# Patient Record
Sex: Male | Born: 1975 | Race: Black or African American | Hispanic: No | Marital: Single | Smoking: Never smoker
Health system: Southern US, Community
[De-identification: ages and names within clinical notes are randomized; demographics above are authoritative.]

## PROBLEM LIST (undated history)

## (undated) DIAGNOSIS — R7303 Prediabetes: Secondary | ICD-10-CM

## (undated) HISTORY — PX: CHOLECYSTECTOMY: SHX55

---

## 2001-08-27 ENCOUNTER — Emergency Department (HOSPITAL_COMMUNITY): Admission: EM | Admit: 2001-08-27 | Discharge: 2001-08-28 | Payer: Self-pay | Admitting: Emergency Medicine

## 2001-08-28 ENCOUNTER — Encounter: Payer: Self-pay | Admitting: *Deleted

## 2016-12-01 ENCOUNTER — Emergency Department (HOSPITAL_COMMUNITY)
Admission: EM | Admit: 2016-12-01 | Discharge: 2016-12-01 | Disposition: A | Discharge status: Home / Self Care | Payer: Self-pay | Attending: Emergency Medicine | Admitting: Emergency Medicine

## 2016-12-01 ENCOUNTER — Emergency Department (HOSPITAL_COMMUNITY): Payer: Self-pay

## 2016-12-01 ENCOUNTER — Encounter (HOSPITAL_COMMUNITY): Payer: Self-pay | Admitting: Emergency Medicine

## 2016-12-01 DIAGNOSIS — R202 Paresthesia of skin: Secondary | ICD-10-CM

## 2016-12-01 DIAGNOSIS — G2581 Restless legs syndrome: Secondary | ICD-10-CM

## 2016-12-01 DIAGNOSIS — R739 Hyperglycemia, unspecified: Secondary | ICD-10-CM | POA: Insufficient documentation

## 2016-12-01 HISTORY — DX: Prediabetes: R73.03

## 2016-12-01 LAB — COMPREHENSIVE METABOLIC PANEL
ALK PHOS: 73 U/L (ref 38–126)
ALT: 35 U/L (ref 17–63)
ANION GAP: 7 (ref 5–15)
AST: 35 U/L (ref 15–41)
Albumin: 4 g/dL (ref 3.5–5.0)
BUN: 16 mg/dL (ref 6–20)
CALCIUM: 9 mg/dL (ref 8.9–10.3)
CO2: 25 mmol/L (ref 22–32)
Chloride: 103 mmol/L (ref 101–111)
Creatinine, Ser: 1.09 mg/dL (ref 0.61–1.24)
Glucose, Bld: 219 mg/dL — ABNORMAL HIGH (ref 65–99)
Potassium: 3.6 mmol/L (ref 3.5–5.1)
SODIUM: 135 mmol/L (ref 135–145)
Total Bilirubin: 1.1 mg/dL (ref 0.3–1.2)
Total Protein: 6.8 g/dL (ref 6.5–8.1)

## 2016-12-01 LAB — CBC
HCT: 41.6 % (ref 39.0–52.0)
Hemoglobin: 14.3 g/dL (ref 13.0–17.0)
MCH: 30.6 pg (ref 26.0–34.0)
MCHC: 34.4 g/dL (ref 30.0–36.0)
MCV: 88.9 fL (ref 78.0–100.0)
PLATELETS: 186 10*3/uL (ref 150–400)
RBC: 4.68 MIL/uL (ref 4.22–5.81)
RDW: 12.4 % (ref 11.5–15.5)
WBC: 10 10*3/uL (ref 4.0–10.5)

## 2016-12-01 LAB — I-STAT CHEM 8, ED
BUN: 16 mg/dL (ref 6–20)
CALCIUM ION: 1.14 mmol/L — AB (ref 1.15–1.40)
CREATININE: 1 mg/dL (ref 0.61–1.24)
Chloride: 102 mmol/L (ref 101–111)
GLUCOSE: 217 mg/dL — AB (ref 65–99)
HCT: 43 % (ref 39.0–52.0)
HEMOGLOBIN: 14.6 g/dL (ref 13.0–17.0)
POTASSIUM: 3.6 mmol/L (ref 3.5–5.1)
Sodium: 139 mmol/L (ref 135–145)
TCO2: 25 mmol/L (ref 0–100)

## 2016-12-01 LAB — DIFFERENTIAL
Basophils Absolute: 0 10*3/uL (ref 0.0–0.1)
Basophils Relative: 0 %
EOS PCT: 1 %
Eosinophils Absolute: 0.1 10*3/uL (ref 0.0–0.7)
LYMPHS PCT: 41 %
Lymphs Abs: 4.1 10*3/uL — ABNORMAL HIGH (ref 0.7–4.0)
MONO ABS: 0.6 10*3/uL (ref 0.1–1.0)
MONOS PCT: 6 %
Neutro Abs: 5.2 10*3/uL (ref 1.7–7.7)
Neutrophils Relative %: 52 %

## 2016-12-01 LAB — CBG MONITORING, ED: GLUCOSE-CAPILLARY: 184 mg/dL — AB (ref 65–99)

## 2016-12-01 LAB — I-STAT TROPONIN, ED: Troponin i, poc: 0 ng/mL (ref 0.00–0.08)

## 2016-12-01 NOTE — Discharge Instructions (Addendum)
It was our pleasure to provide your ER care today - we hope that you feel better.  Rest. Drink plenty of fluids/water.   Take tylenol as need if pain.  From todays lab work, your blood sugar is high (217) - drink adequate water, follow diabetic diet, and follow up with primary care doctor this coming week.   May try taking benadryl before going to be to see if that helps symptoms.   Follow up with primary care doctor in the coming week.  Also follow up with neurologist in 1 week - see referral - call office tomorrow AM to arrange appointment.   Return to ER if worse, new symptoms, one-sided loss of sensation or weakness, new or severe pain, fevers, other concern.

## 2016-12-01 NOTE — ED Provider Notes (Signed)
[facility]-EMERGENCY DEPT Provider Note   CSN: [id number] Arrival date & time: 12/01/16  1449     History   Chief Complaint Chief Complaint  Patient presents with  . Numbness    HPI Burch Marchuk is a 41 y.o. male.  Patient c/o intermittent numbness, and feeling of restless legs for the past week or two.  States the numbness is intermittent, and varies from right to left, and from arms to legs. No constant numbness or loss of sensation. Patient also notes at night when he tried to sleep his legs seem restless. Also is intermittent and states occasionally his arms feel that way too.  Denies hx same symptoms. Denies neck or back pain. No radicular pain. Is attending to ADLs, and exercising per normal routine. No change in speech or vision. No problems w gait. Is able to jog.  No trauma/injury. No fever or chills. Eating and drinking normally. No dysuria, polyuria or gu c/o. No wt change. No recent febrile, viral or flu like illness. No recent stressors/anxiety.    The history is provided by the patient.    Past Medical History:  Diagnosis Date  . Pre-diabetes     There are no active problems to display for this patient.   Past Surgical History:  Procedure Laterality Date  . CHOLECYSTECTOMY         Home Medications    Prior to Admission medications   Not on File    Family History No family history on file.  Social History Social History  Substance Use Topics  . Smoking status: Never Smoker  . Smokeless tobacco: Never Used  . Alcohol use Yes     Allergies   Patient has no known allergies.   Review of Systems Review of Systems  Constitutional: Negative for chills and fever.  HENT: Negative for sore throat and trouble swallowing.   Eyes: Negative for visual disturbance.  Respiratory: Negative for cough and shortness of breath.   Cardiovascular: Negative for chest pain.  Gastrointestinal: Negative for abdominal pain, diarrhea and vomiting.  Endocrine: Negative  for polyuria.  Genitourinary: Negative for dysuria and flank pain.  Musculoskeletal: Negative for back pain and neck pain.  Skin: Negative for rash.  Neurological: Negative for speech difficulty, weakness and headaches.  Hematological: Does not bruise/bleed easily.  Psychiatric/Behavioral: Negative for confusion.     Physical Exam Updated Vital Signs BP (!) 121/99   Pulse 80   Temp 98.2 F (36.8 C) (Oral)   Resp 20   Ht  (1.778 m)   Wt 81.6 kg   SpO2 97%   BMI 25.83 kg/m   Physical Exam  Constitutional: He is oriented to person, place, and time. He appears well-developed and well-nourished. No distress.  HENT:  Head: Atraumatic.  Mouth/Throat: Oropharynx is clear and moist.  Eyes: Conjunctivae and EOM are normal. Pupils are equal, round, and reactive to light.  Neck: Neck supple. No tracheal deviation present. No thyromegaly present.  No bruits  Cardiovascular: Normal rate, regular rhythm, normal heart sounds and intact distal pulses.  Exam reveals no gallop and no friction rub.   No murmur heard. Pulmonary/Chest: Effort normal and breath sounds normal. No accessory muscle usage. No respiratory distress.  Abdominal: Soft. Bowel sounds are normal. He exhibits no distension. There is no tenderness.  Musculoskeletal: He exhibits no edema or tenderness.  CTLS spine, non tender, aligned, no step off. Distal pulses palp bil. No swelling/edema.   Neurological: He is alert and oriented to person, place,  and time. He displays normal reflexes. No cranial nerve deficit.  Speech clear/fluent. Motor intact bil, stre 5/5. sens grossly intact. No pronator drift. Ambulates w steady gait. No ataxia. Can balance on each foot. Romberg unremarkable.   Skin: Skin is warm and dry. No rash noted. He is not diaphoretic.  Psychiatric: He has a normal mood and affect.  Nursing note and vitals reviewed.    ED Treatments / Results  Labs (all labs ordered are listed, but only abnormal  results are displayed)  Results for orders placed or performed during the hospital encounter of 12/01/16  CBC  Result Value Ref Range   WBC 10.0 4.0 - 10.5 K/uL   RBC 4.68 4.22 - 5.81 MIL/uL   Hemoglobin 14.3 13.0 - 17.0 g/dL   HCT 60.4 54.0 - 98.1 %   MCV 88.9 78.0 - 100.0 fL   MCH 30.6 26.0 - 34.0 pg   MCHC 34.4 30.0 - 36.0 g/dL   RDW 19.1 47.8 - 29.5 %   Platelets 186 150 - 400 K/uL  Differential  Result Value Ref Range   Neutrophils Relative % 52 %   Neutro Abs 5.2 1.7 - 7.7 K/uL   Lymphocytes Relative 41 %   Lymphs Abs 4.1 (H) 0.7 - 4.0 K/uL   Monocytes Relative 6 %   Monocytes Absolute 0.6 0.1 - 1.0 K/uL   Eosinophils Relative 1 %   Eosinophils Absolute 0.1 0.0 - 0.7 K/uL   Basophils Relative 0 %   Basophils Absolute 0.0 0.0 - 0.1 K/uL  Comprehensive metabolic panel  Result Value Ref Range   Sodium 135 135 - 145 mmol/L   Potassium 3.6 3.5 - 5.1 mmol/L   Chloride 103 101 - 111 mmol/L   CO2 25 22 - 32 mmol/L   Glucose, Bld 219 (H) 65 - 99 mg/dL   BUN 16 6 - 20 mg/dL   Creatinine, Ser 6.21 0.61 - 1.24 mg/dL   Calcium 9.0 8.9 - 30.8 mg/dL   Total Protein 6.8 6.5 - 8.1 g/dL   Albumin 4.0 3.5 - 5.0 g/dL   AST 35 15 - 41 U/L   ALT 35 17 - 63 U/L   Alkaline Phosphatase 73 38 - 126 U/L   Total Bilirubin 1.1 0.3 - 1.2 mg/dL   GFR calc non Af Amer >60 >60 mL/min   GFR calc Af Amer >60 >60 mL/min   Anion gap 7 5 - 15  I-stat troponin, ED  Result Value Ref Range   Troponin i, poc 0.00 0.00 - 0.08 ng/mL   Comment 3          CBG monitoring, ED  Result Value Ref Range   Glucose-Capillary 184 (H) 65 - 99 mg/dL  I-Stat Chem 8, ED  Result Value Ref Range   Sodium 139 135 - 145 mmol/L   Potassium 3.6 3.5 - 5.1 mmol/L   Chloride 102 101 - 111 mmol/L   BUN 16 6 - 20 mg/dL   Creatinine, Ser 6.57 0.61 - 1.24 mg/dL   Glucose, Bld 846 (H) 65 - 99 mg/dL   Calcium, Ion 9.62 (L) 1.15 - 1.40 mmol/L   TCO2 25 0 - 100 mmol/L   Hemoglobin 14.6 13.0 - 17.0 g/dL   HCT 95.2 84.1 -  32.4 %   Ct Head Wo Contrast  Result Date: 12/01/2016 CLINICAL DATA:  Left-sided numbness and weakness for 1 week, initial encounter EXAM: CT HEAD WITHOUT CONTRAST TECHNIQUE: Contiguous axial images were obtained from the base of  the skull through the vertex without intravenous contrast. COMPARISON:  None. FINDINGS: Brain: No evidence of acute infarction, hemorrhage, hydrocephalus, extra-axial collection or mass lesion/mass effect. Vascular: No hyperdense vessel or unexpected calcification. Skull: Normal. Negative for fracture or focal lesion. Sinuses/Orbits: No acute finding. Other: None. IMPRESSION: No acute intracranial abnormality noted. Electronically Signed   By: Alcide Clever M.D.   On: 12/01/2016 16:25    EKG  EKG Interpretation  Date/Time:  Sunday December 01 2016 15:02:18 EDT Ventricular Rate:  81 PR Interval:  174 QRS Duration: 80 QT Interval:  352 QTC Calculation: 408 R Axis:   57 Text Interpretation:  Normal sinus rhythm Normal ECG No previous tracing Confirmed by Denton Lank  MD, Caryn Bee ([id number]) on 12/01/2016 4:05:27 PM       Radiology Ct Head Wo Contrast  Result Date: 12/01/2016 CLINICAL DATA:  Left-sided numbness and weakness for 1 week, initial encounter EXAM: CT HEAD WITHOUT CONTRAST TECHNIQUE: Contiguous axial images were obtained from the base of the skull through the vertex without intravenous contrast. COMPARISON:  None. FINDINGS: Brain: No evidence of acute infarction, hemorrhage, hydrocephalus, extra-axial collection or mass lesion/mass effect. Vascular: No hyperdense vessel or unexpected calcification. Skull: Normal. Negative for fracture or focal lesion. Sinuses/Orbits: No acute finding. Other: None. IMPRESSION: No acute intracranial abnormality noted. Electronically Signed   By: Alcide Clever M.D.   On: 12/01/2016 16:25    Procedures Procedures (including critical care time)  Medications Ordered in ED Medications - No data to display   Initial Impression / Assessment  and Plan / ED Course  I have reviewed the triage vital signs and the nursing notes.  Pertinent labs & imaging results that were available during my care of the patient were reviewed by me and considered in my medical decision making (see chart for details).  No focal deficits on neuro exam.   No current tremor. No weakness. No problems w balance or coordination noted.  Labs and ct unremarkable.   Patient currently appears stable for d/c.  rec close outpt pcp and neurology f/u.     Final Clinical Impressions(s) / ED Diagnoses   Final diagnoses:  None    New Prescriptions New Prescriptions   No medications on file     Cathren Laine, MD 12/01/16 1701

## 2016-12-01 NOTE — ED Triage Notes (Signed)
Pt c/o left sided numbness ongoing for 1 week. Pt also reports intermittent numbness to right side as well. Pt reports that when he ambulates he notices a limp.

## 2016-12-01 NOTE — ED Notes (Signed)
Patient transported to CT 

## 2016-12-01 NOTE — ED Notes (Signed)
Returned from ct scan 

## 2017-12-06 IMAGING — CT CT HEAD W/O CM
3 of 4 series · 18 of 47 positions shown, 21 images · non-contrast
Comparison: None.

CLINICAL DATA: Left-sided numbness and weakness for 1 week, initial
encounter

EXAM:
CT HEAD WITHOUT CONTRAST
TECHNIQUE: Contiguous axial images were obtained from the base of the skull
through the vertex without intravenous contrast.

[Series 201: head w/o, idose (1) · axial · non-contrast · 0.46mm/px · z∈[+95,+225]mm · 12 of 32 slices shown, 15 images]
[im 3/32  brain]
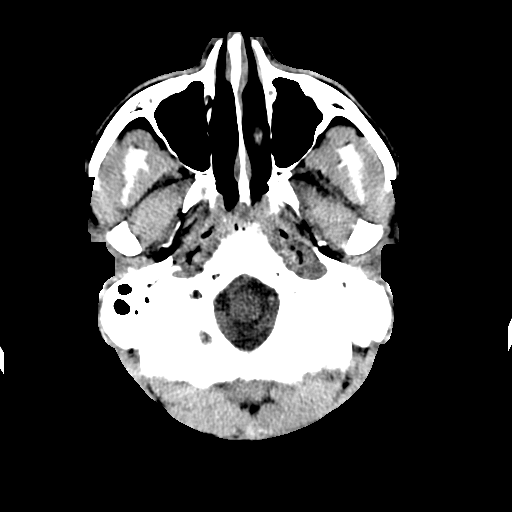
[im 3/32  bone]
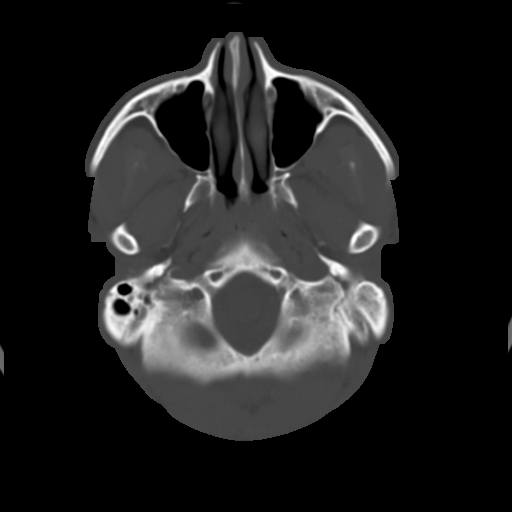
[im 5/32  brain]
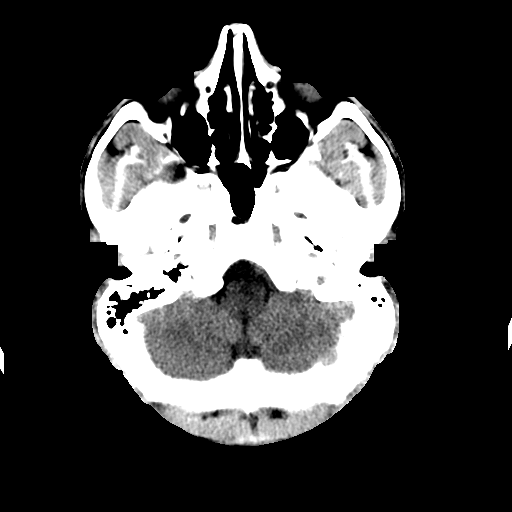
[im 7/32  brain]
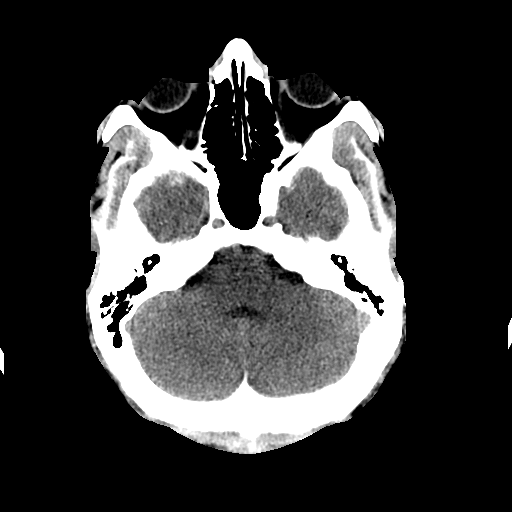
[im 9/32  brain]
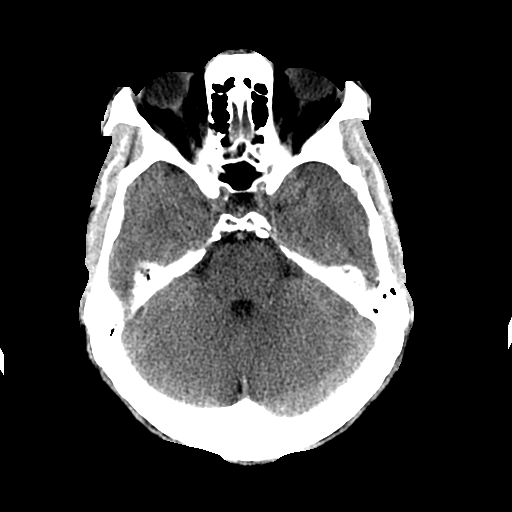
[im 12/32  brain]
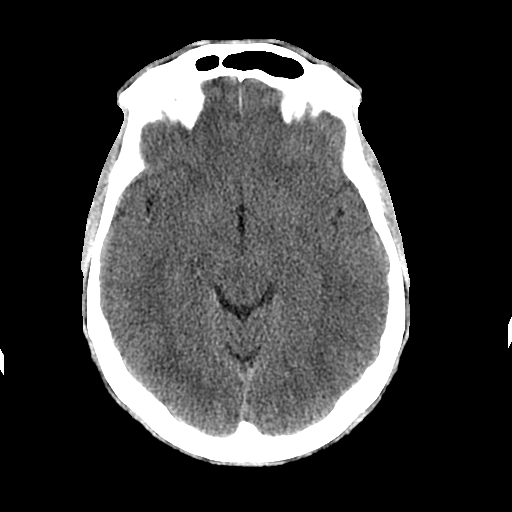
[im 12/32  bone]
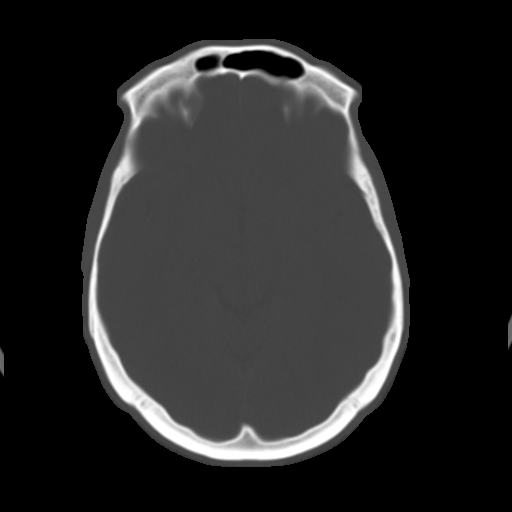
[im 14/32  brain]
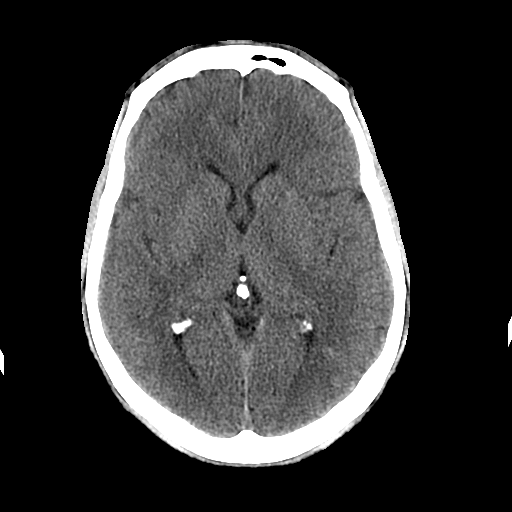
[im 18/32  brain]
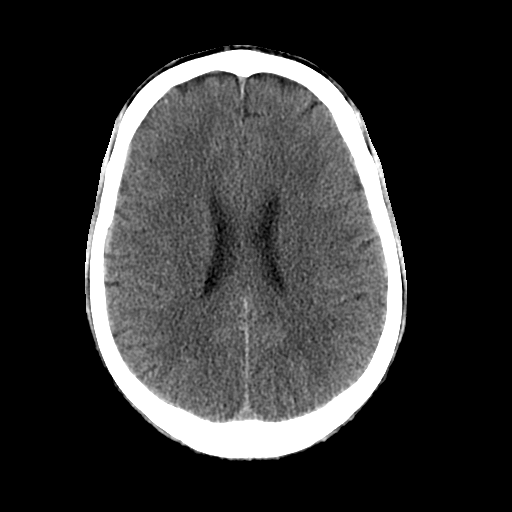
[im 20/32  brain]
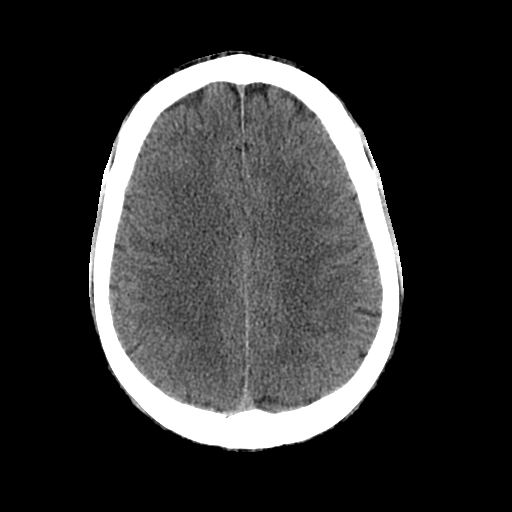
[im 23/32  brain]
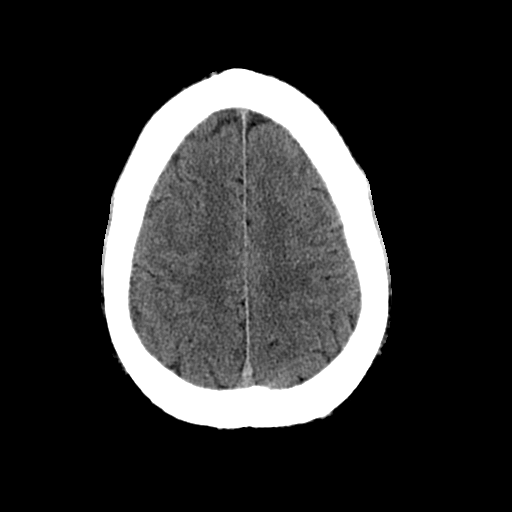
[im 23/32  bone]
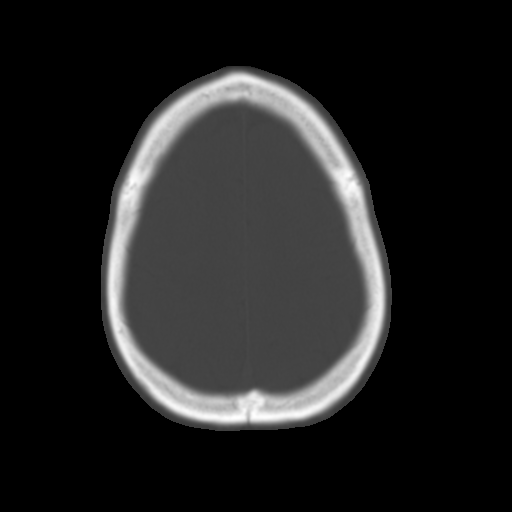
[im 25/32  brain]
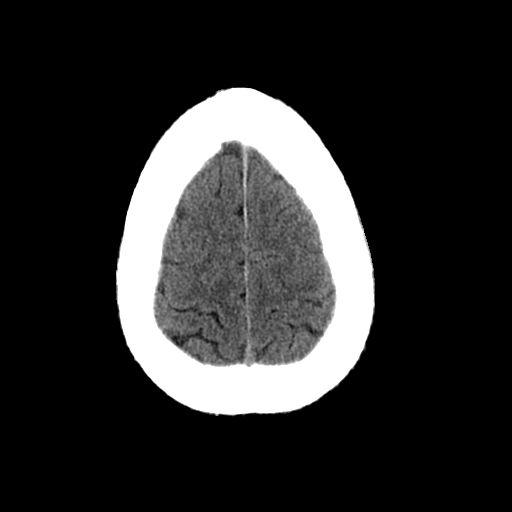
[im 27/32  brain]
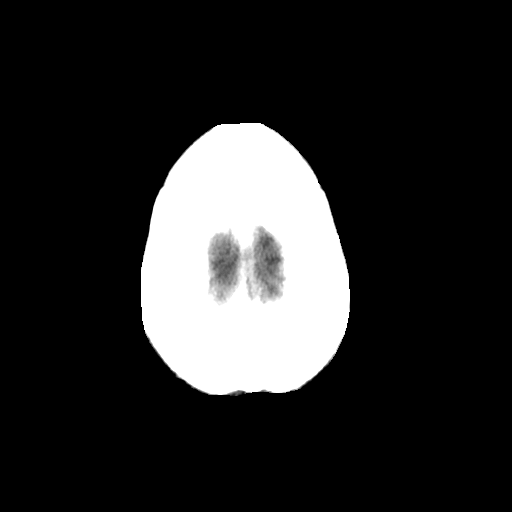
[im 29/32  brain]
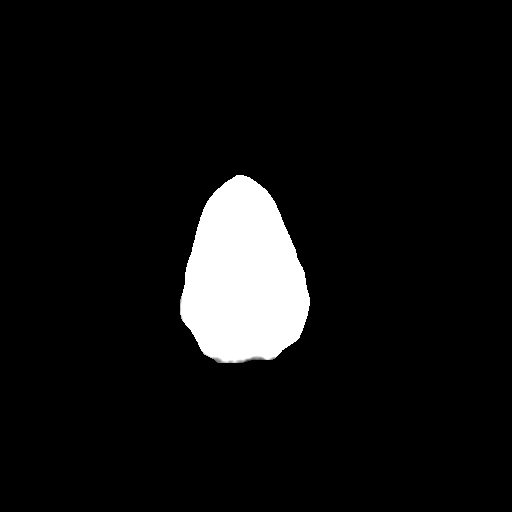

[Series 203: coronal st, idose (1) · coronal · 0.42mm/px · 3 of 75 slices shown]
[im 25/75  brain]
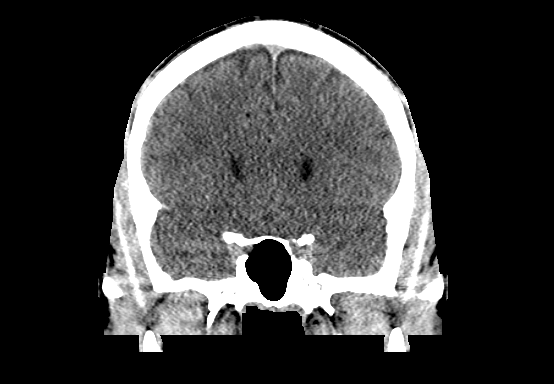
[im 33/75  brain]
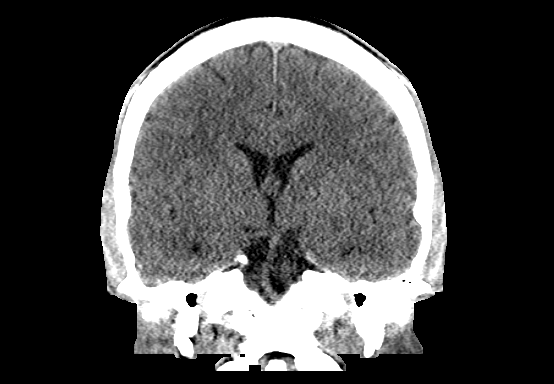
[im 42/75  brain]
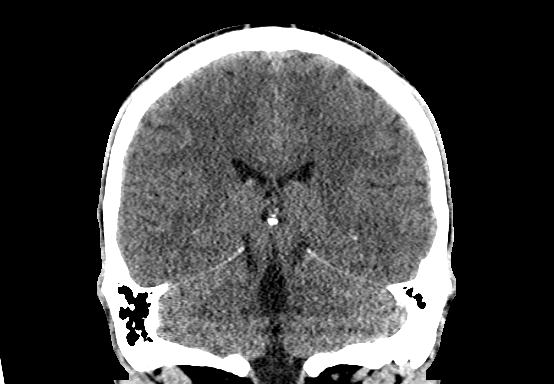

[Series 204: sagittal st, idose (1) · sagittal · 0.40mm/px · 3 of 78 slices shown]
[im 26/78  brain]
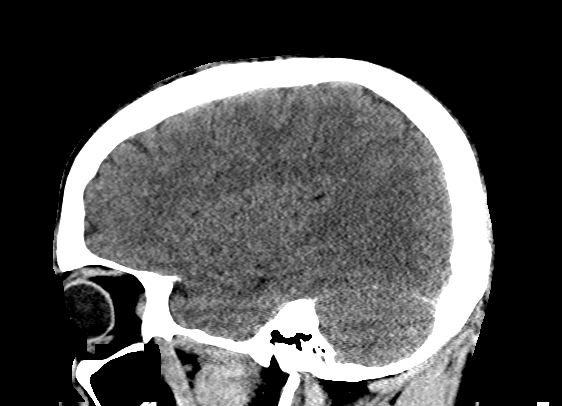
[im 39/78  brain]
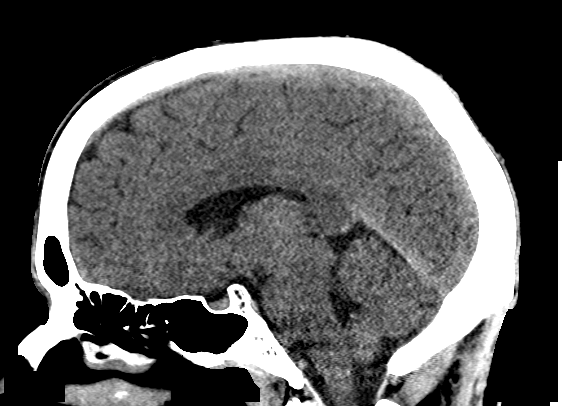
[im 52/78  brain]
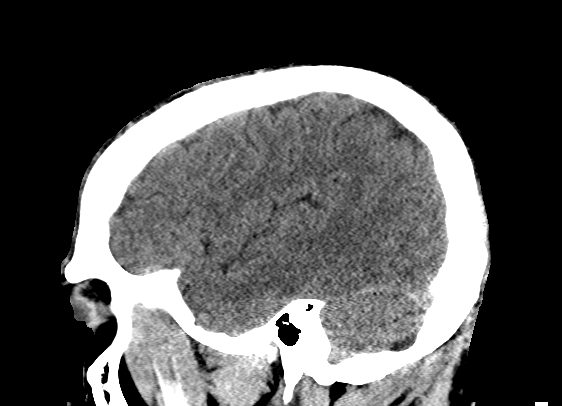

[18 of 47 positions shown; findings below may reference images not displayed]

FINDINGS: Brain: No evidence of acute infarction, hemorrhage, hydrocephalus,
extra-axial collection or mass lesion/mass effect.

Vascular: No hyperdense vessel or unexpected calcification.

Skull: Normal. Negative for fracture or focal lesion.

Sinuses/Orbits: No acute finding.

Other: None.
IMPRESSION: No acute intracranial abnormality noted.
